# Patient Record
Sex: Male | Born: 1985 | Hispanic: No | Marital: Single | Smoking: Never smoker
Health system: Southern US, Community
[De-identification: ages and names within clinical notes are randomized; demographics above are authoritative.]

## PROBLEM LIST (undated history)

## (undated) DIAGNOSIS — N289 Disorder of kidney and ureter, unspecified: Secondary | ICD-10-CM

---

## 2017-03-01 ENCOUNTER — Emergency Department (HOSPITAL_COMMUNITY): Payer: BLUE CROSS/BLUE SHIELD

## 2017-03-01 ENCOUNTER — Encounter (HOSPITAL_COMMUNITY): Payer: Self-pay | Admitting: Emergency Medicine

## 2017-03-01 ENCOUNTER — Emergency Department (HOSPITAL_COMMUNITY)
Admission: EM | Admit: 2017-03-01 | Discharge: 2017-03-01 | Disposition: A | Discharge status: Home / Self Care | Payer: BLUE CROSS/BLUE SHIELD | Attending: Emergency Medicine | Admitting: Emergency Medicine

## 2017-03-01 DIAGNOSIS — Z79899 Other long term (current) drug therapy: Secondary | ICD-10-CM | POA: Insufficient documentation

## 2017-03-01 DIAGNOSIS — N201 Calculus of ureter: Secondary | ICD-10-CM | POA: Diagnosis not present

## 2017-03-01 DIAGNOSIS — R109 Unspecified abdominal pain: Secondary | ICD-10-CM | POA: Diagnosis present

## 2017-03-01 HISTORY — DX: Disorder of kidney and ureter, unspecified: N28.9

## 2017-03-01 LAB — BASIC METABOLIC PANEL
Anion gap: 9 (ref 5–15)
BUN: 16 mg/dL (ref 6–20)
CHLORIDE: 106 mmol/L (ref 101–111)
CO2: 27 mmol/L (ref 22–32)
CREATININE: 1.46 mg/dL — AB (ref 0.61–1.24)
Calcium: 9.2 mg/dL (ref 8.9–10.3)
GFR calc Af Amer: >60 mL/min (ref >60)
GFR calc non Af Amer: >60 mL/min (ref >60)
Glucose, Bld: 131 mg/dL — ABNORMAL HIGH (ref 65–99)
Potassium: 3.6 mmol/L (ref 3.5–5.1)
Sodium: 142 mmol/L (ref 135–145)

## 2017-03-01 LAB — CBC WITH DIFFERENTIAL/PLATELET
Basophils Absolute: 0 10*3/uL (ref 0.0–0.1)
Basophils Relative: 0 %
Eosinophils Absolute: 0.2 10*3/uL (ref 0.0–0.7)
Eosinophils Relative: 3 %
HEMATOCRIT: 39.7 % (ref 39.0–52.0)
HEMOGLOBIN: 13 g/dL (ref 13.0–17.0)
LYMPHS ABS: 1.4 10*3/uL (ref 0.7–4.0)
Lymphocytes Relative: 18 %
MCH: 26.2 pg (ref 26.0–34.0)
MCHC: 32.7 g/dL (ref 30.0–36.0)
MCV: 79.9 fL (ref 78.0–100.0)
MONOS PCT: 5 %
Monocytes Absolute: 0.4 10*3/uL (ref 0.1–1.0)
NEUTROS ABS: 5.7 10*3/uL (ref 1.7–7.7)
NEUTROS PCT: 74 %
Platelets: 174 10*3/uL (ref 150–400)
RBC: 4.97 MIL/uL (ref 4.22–5.81)
RDW: 13.5 % (ref 11.5–15.5)
WBC: 7.6 10*3/uL (ref 4.0–10.5)

## 2017-03-01 LAB — URINALYSIS, ROUTINE W REFLEX MICROSCOPIC
BILIRUBIN URINE: NEGATIVE
Glucose, UA: NEGATIVE mg/dL
Hgb urine dipstick: NEGATIVE
Ketones, ur: NEGATIVE mg/dL
LEUKOCYTES UA: NEGATIVE
Nitrite: NEGATIVE
PH: 9 — AB (ref 5.0–8.0)
Protein, ur: 100 mg/dL — AB
SPECIFIC GRAVITY, URINE: 1.025 (ref 1.005–1.030)

## 2017-03-01 LAB — HEPATIC FUNCTION PANEL
ALBUMIN: 4.1 g/dL (ref 3.5–5.0)
ALK PHOS: 66 U/L (ref 38–126)
ALT: 33 U/L (ref 17–63)
AST: 29 U/L (ref 15–41)
BILIRUBIN INDIRECT: 0.4 mg/dL (ref 0.3–0.9)
BILIRUBIN TOTAL: 0.5 mg/dL (ref 0.3–1.2)
Bilirubin, Direct: 0.1 mg/dL (ref 0.1–0.5)
Total Protein: 7.7 g/dL (ref 6.5–8.1)

## 2017-03-01 LAB — LIPASE, BLOOD: LIPASE: 26 U/L (ref 11–51)

## 2017-03-01 MED ORDER — TAMSULOSIN HCL 0.4 MG PO CAPS
0.4000 mg | ORAL_CAPSULE | Freq: Once | ORAL | Status: AC
  Administered 2017-03-01: 0.4 mg via ORAL
  Filled 2017-03-01: qty 1

## 2017-03-01 MED ORDER — HYDROMORPHONE HCL 1 MG/ML IJ SOLN: 1.0000 mg | Freq: Once | INTRAMUSCULAR | Status: DC

## 2017-03-01 MED ORDER — OXYCODONE-ACETAMINOPHEN 5-325 MG PO TABS: 1.0000 | ORAL_TABLET | ORAL | 0 refills | Status: AC | PRN

## 2017-03-01 MED ORDER — ONDANSETRON 8 MG PO TBDP: 8.0000 mg | ORAL_TABLET | Freq: Three times a day (TID) | ORAL | 0 refills | Status: AC | PRN

## 2017-03-01 MED ORDER — ONDANSETRON HCL 4 MG/5ML PO SOLN: 4.0000 mg | Freq: Once | ORAL | Status: DC

## 2017-03-01 MED ORDER — PROMETHAZINE HCL 25 MG/ML IJ SOLN
12.5000 mg | Freq: Once | INTRAMUSCULAR | Status: AC
  Administered 2017-03-01: 12.5 mg via INTRAVENOUS
  Filled 2017-03-01: qty 1

## 2017-03-01 MED ORDER — TAMSULOSIN HCL 0.4 MG PO CAPS: 0.4000 mg | ORAL_CAPSULE | Freq: Every day | ORAL | 0 refills | Status: AC

## 2017-03-01 MED ORDER — ONDANSETRON HCL 4 MG/2ML IJ SOLN
INTRAMUSCULAR | Status: AC
  Administered 2017-03-01: 4 mg
  Filled 2017-03-01: qty 2

## 2017-03-01 MED ORDER — MORPHINE SULFATE (PF) 4 MG/ML IV SOLN
4.0000 mg | INTRAVENOUS | Status: DC | PRN
  Administered 2017-03-01: 4 mg via INTRAVENOUS
  Filled 2017-03-01: qty 1

## 2017-03-01 MED ORDER — ONDANSETRON HCL 4 MG/2ML IJ SOLN: 4.0000 mg | Freq: Once | INTRAMUSCULAR | Status: DC

## 2017-03-01 NOTE — ED Notes (Signed)
Last meal last night was at [employer] at about 2330.

## 2017-03-01 NOTE — ED Notes (Signed)
Patient transported to CT 

## 2017-03-01 NOTE — ED Notes (Signed)
Pt has vomited multiple times.

## 2017-03-01 NOTE — ED Notes (Signed)
Pt return from bathroom, iv infiltrated.

## 2017-03-01 NOTE — ED Notes (Signed)
2 failed attempts at starting iv.  Charge to attempt.

## 2017-03-01 NOTE — ED Triage Notes (Signed)
Pt c/o sudden right upper flank pain with n/v x 12 since this am. Hx of kidney stones. States feels like one. Pt pointing to ruq lateral area with pain. Pt had emesis bag with large amount of emesis in it during triage. Pt mildy restles. .Marland Kitchen

## 2017-03-01 NOTE — Discharge Instructions (Addendum)
Strain your urine so you will know when this stone passes.  Save for your urology appointment.  You may take the oxycodone prescribed for pain relief.  This will make you drowsy - do not drive within 4 hours of taking this medication. Take your next dose of Flomax before bed tomorrow night as you received todays dose here. This medicine may help your stone pass quicker.

## 2017-03-01 NOTE — ED Notes (Signed)
Pt continues to have periods on vomiting and pain to right lateral abdomen.

## 2017-03-03 NOTE — ED Provider Notes (Signed)
[facility] DEPT Provider Note   CSN: [id number] Arrival date & time: 03/01/17  4098     History   Chief Complaint Chief Complaint  Patient presents with  . Flank Pain    HPI Geoffery Aultman is a 31 y.o. male.  The history is provided by the patient.  Flank Pain  This is a new problem. The current episode started 3 to 5 hours ago (right flank pain waking him from sleep). The problem occurs constantly. The problem has not changed since onset.Associated symptoms include abdominal pain. Pertinent negatives include no chest pain, no headaches and no shortness of breath. Associated symptoms comments: Nausea and emesis.  Denies fevers, hematuria, dysuria.. Nothing aggravates the symptoms. Nothing relieves the symptoms. He has tried nothing for the symptoms.    Past Medical History:  Diagnosis Date  . Renal disorder     There are no active problems to display for this patient.   History reviewed. No pertinent surgical history.     Home Medications    Prior to Admission medications   Medication Sig Start Date End Date Taking? Authorizing Provider  ondansetron (ZOFRAN ODT) 8 MG disintegrating tablet Take 1 tablet (8 mg total) by mouth every 8 (eight) hours as needed for nausea or vomiting. 03/01/17   Taz Vanness, Raynelle Fanning, PA-C  oxyCODONE-acetaminophen (PERCOCET/ROXICET) 5-325 MG tablet Take 1 tablet by mouth every 4 (four) hours as needed. 03/01/17   Burgess Amor, PA-C  tamsulosin (FLOMAX) 0.4 MG CAPS capsule Take 1 capsule (0.4 mg total) by mouth daily after supper. 03/01/17   Burgess Amor, PA-C    Family History History reviewed. No pertinent family history.  Social History Social History  Substance Use Topics  . Smoking status: Never Smoker  . Smokeless tobacco: Never Used  . Alcohol use Yes     Comment: occ      Allergies   Cashew nut oil   Review of Systems Review of Systems  Constitutional: Negative for chills and fever.  HENT: Negative.   Respiratory: Negative.   Negative for shortness of breath.   Cardiovascular: Negative.  Negative for chest pain.  Gastrointestinal: Positive for abdominal pain, nausea and vomiting. Negative for diarrhea.  Genitourinary: Positive for flank pain. Negative for dysuria and hematuria.  Neurological: Negative for headaches.     Physical Exam Updated Vital Signs BP 132/87   Pulse (!) 59   Temp 98.2 F (36.8 C) (Oral)   Resp 16   Ht 5\' 11"  (1.803 m)   Wt 81.6 kg (180 lb)   SpO2 99%   BMI 25.10 kg/m   Physical Exam  Constitutional: He appears well-developed and well-nourished.  HENT:  Head: Normocephalic and atraumatic.  Eyes: Conjunctivae are normal.  Neck: Normal range of motion.  Cardiovascular: Normal rate, regular rhythm, normal heart sounds and intact distal pulses.   Pulmonary/Chest: Effort normal and breath sounds normal. He has no wheezes.  Abdominal: Soft. Bowel sounds are normal. He exhibits no distension. There is tenderness in the right upper quadrant. There is CVA tenderness. There is negative Murphy's sign.  Musculoskeletal: Normal range of motion.  Neurological: He is alert.  Skin: Skin is warm and dry.  Psychiatric: He has a normal mood and affect.  Nursing note and vitals reviewed.    ED Treatments / Results  Labs (all labs ordered are listed, but only abnormal results are displayed) Labs Reviewed  URINALYSIS, ROUTINE W REFLEX MICROSCOPIC - Abnormal; Notable for the following:       Result Value  APPearance CLOUDY (*)    pH 9.0 (*)    Protein, ur 100 (*)    Bacteria, UA FEW (*)    Squamous Epithelial / LPF 0-5 (*)    All other components within normal limits  BASIC METABOLIC PANEL - Abnormal; Notable for the following:    Glucose, Bld 131 (*)    Creatinine, Ser 1.46 (*)    All other components within normal limits  CBC WITH DIFFERENTIAL/PLATELET  HEPATIC FUNCTION PANEL  LIPASE, BLOOD    EKG  EKG Interpretation None       Radiology Ct Renal Stone Study  Result  Date: 03/01/2017 CLINICAL DATA:  31 year old male with sudden onset right upper flank pain nausea and vomiting since this morning. EXAM: CT ABDOMEN AND PELVIS WITHOUT CONTRAST TECHNIQUE: Multidetector CT imaging of the abdomen and pelvis was performed following the standard protocol without IV contrast. COMPARISON:  None. FINDINGS: Lower chest: Minimal atelectasis on the left, otherwise normal lung bases. No pericardial or pleural effusion. Hepatobiliary: Negative noncontrast liver and gallbladder. Pancreas: Negative. Spleen: Negative. Adrenals/Urinary Tract: Normal adrenal glands. Several punctate calculi in the left kidney which otherwise has a normal noncontrast appearance. No left nephrolithiasis. The visible left ureter appears normal. The right kidney is asymmetrically hypodense and indistinct with moderate right hydronephrosis. Mild perinephric stranding. Mild to moderate right hydroureter with periureteral stranding. The right ureter remains dilated to the ureterovesical junction where a 4 mm calculus is located at the UVJ or just inside the urinary bladder. The bladder is otherwise decompressed and negative. There are scattered bilateral pelvic phleboliths. No other right nephrolithiasis. Stomach/Bowel: Decompressed rectum. Redundant but mostly decompressed sigmoid colon. Decompressed left colon. Some gas and stool in the transverse colon and right colon. The cecum is mostly located in the right hemipelvis. No inflammation of the appendix. Small bowel is mostly decompressed throughout the abdomen. Decompressed stomach and duodenum. No abdominal free fluid. Vascular/Lymphatic: Vascular patency is not evaluated in the absence of IV contrast. Reproductive: Negative. Other: No pelvic free fluid. Musculoskeletal: Negative. IMPRESSION: 1. Acute obstructive uropathy on the right with a 4 mm calculus at the right UVJ or just inside the bladder. 2. No other right nephrolithiasis.  Punctate left nephrolithiasis.  Electronically Signed   By: Odessa FlemingH  Hall M.D.   On: 03/01/2017 11:33    Procedures Procedures (including critical care time)  Medications Ordered in ED Medications  ondansetron (ZOFRAN) 4 MG/2ML injection (4 mg  Given 03/01/17 0824)  promethazine (PHENERGAN) injection 12.5 mg (12.5 mg Intravenous Given 03/01/17 0839)  promethazine (PHENERGAN) injection 12.5 mg (12.5 mg Intravenous Given 03/01/17 0920)  tamsulosin (FLOMAX) capsule 0.4 mg (0.4 mg Oral Given 03/01/17 1209)     Initial Impression / Assessment and Plan / ED Course  I have reviewed the triage vital signs and the nursing notes.  Pertinent labs & imaging results that were available during my care of the patient were reviewed by me and considered in my medical decision making (see chart for details).     Pt with right ureterolithiasis. Strict return precautions discussed with pt and wife.  Urine strainer provided. Referral to urology for f/u within one week. Pt improved at time of dc.  Final Clinical Impressions(s) / ED Diagnoses   Final diagnoses:  Ureterolithiasis    New Prescriptions Discharge Medication List as of 03/01/2017 12:11 PM    START taking these medications   Details  ondansetron (ZOFRAN ODT) 8 MG disintegrating tablet Take 1 tablet (8 mg total) by mouth every  8 (eight) hours as needed for nausea or vomiting., Starting Thu 03/01/2017, Print    oxyCODONE-acetaminophen (PERCOCET/ROXICET) 5-325 MG tablet Take 1 tablet by mouth every 4 (four) hours as needed., Starting Thu 03/01/2017, Print    tamsulosin (FLOMAX) 0.4 MG CAPS capsule Take 1 capsule (0.4 mg total) by mouth daily after supper., Starting Thu 03/01/2017, Print         Burgess Amor, PA-C 03/03/17 0927    Samuel Jester, DO 03/05/17 Rickey Primus

## 2018-06-29 IMAGING — CT CT RENAL STONE PROTOCOL
2 of 4 series · 16 of 46 positions shown, 18 images · non-contrast
Comparison: None.

CLINICAL DATA: 31-year-old male with sudden onset right upper flank
pain nausea and vomiting since this morning.

EXAM:
CT ABDOMEN AND PELVIS WITHOUT CONTRAST
TECHNIQUE: Multidetector CT imaging of the abdomen and pelvis was performed
following the standard protocol without IV contrast.

[Series 2: axial st · axial · 0.67mm/px · z∈[-470,-76]mm · 13 of 87 slices shown, 15 images]
[im 4/87  soft-tissue]
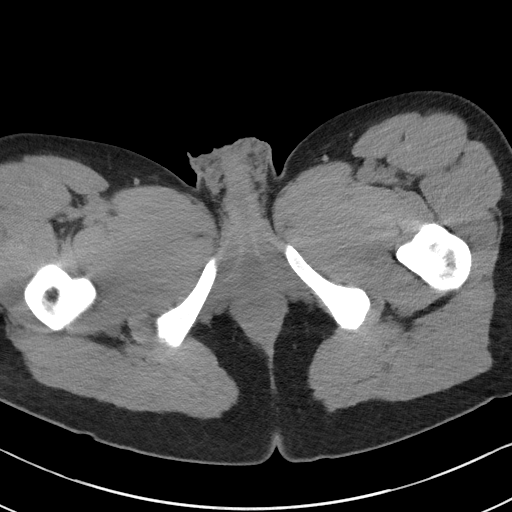
[im 4/87  bone]
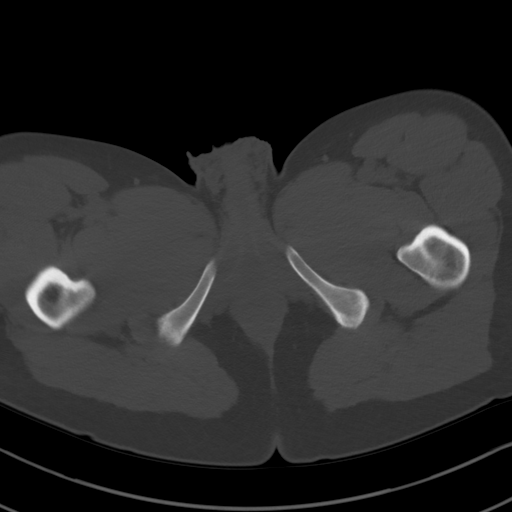
[im 11/87  soft-tissue]
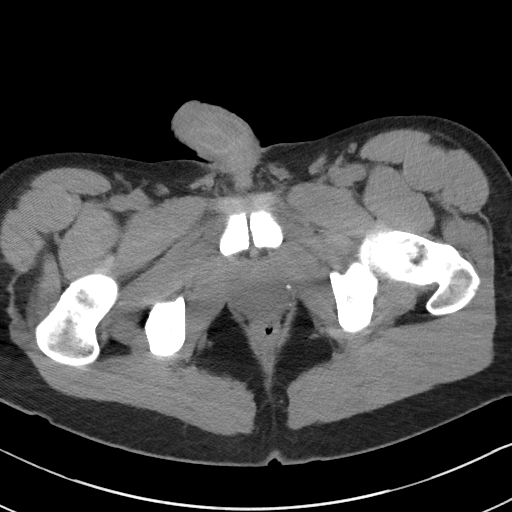
[im 18/87  soft-tissue]
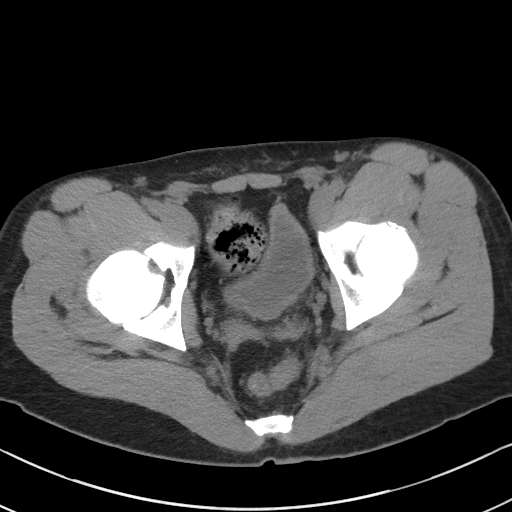
[im 25/87  soft-tissue]
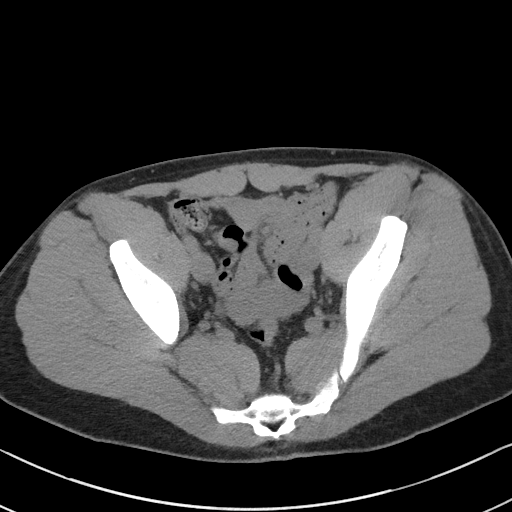
[im 31/87  soft-tissue]
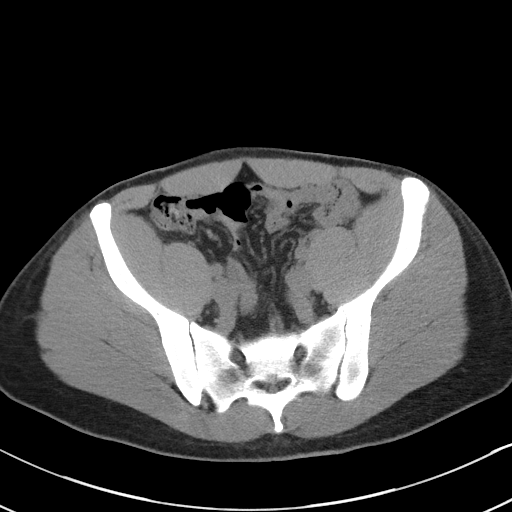
[im 38/87  soft-tissue]
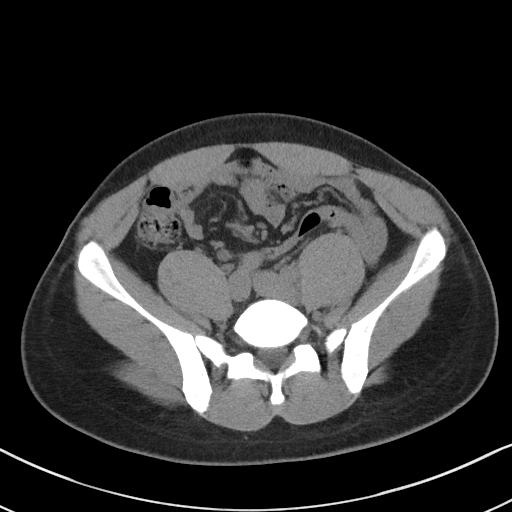
[im 45/87  soft-tissue]
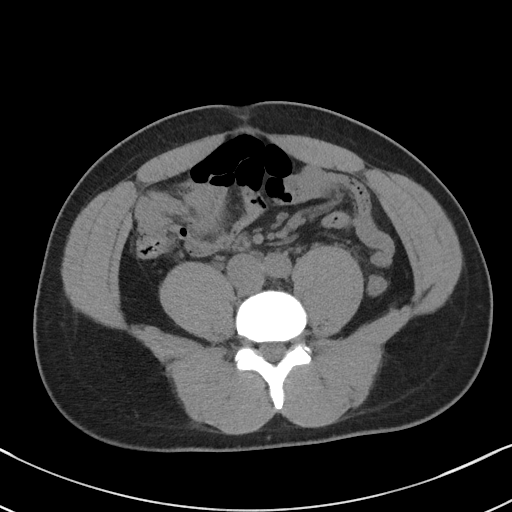
[im 49/87  soft-tissue]
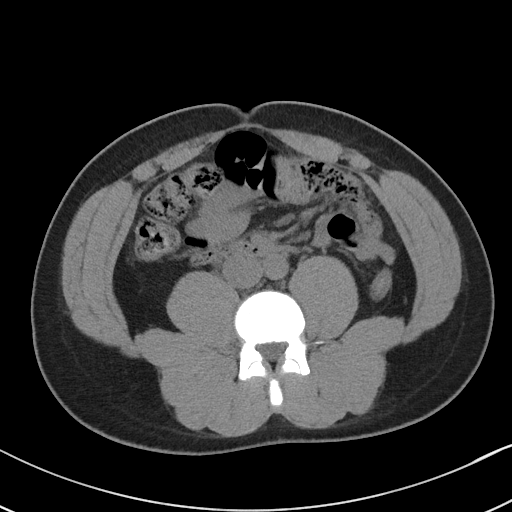
[im 56/87  soft-tissue]
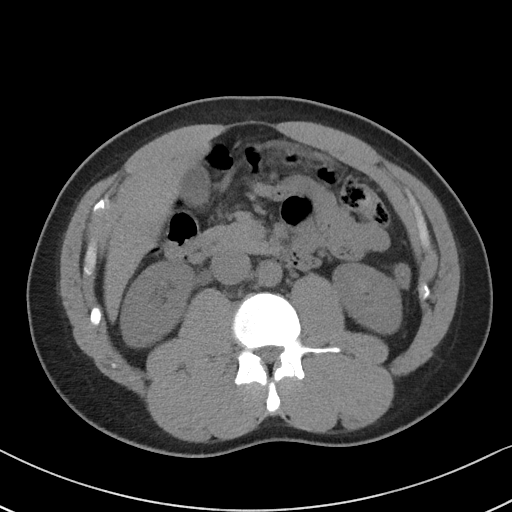
[im 56/87  bone]
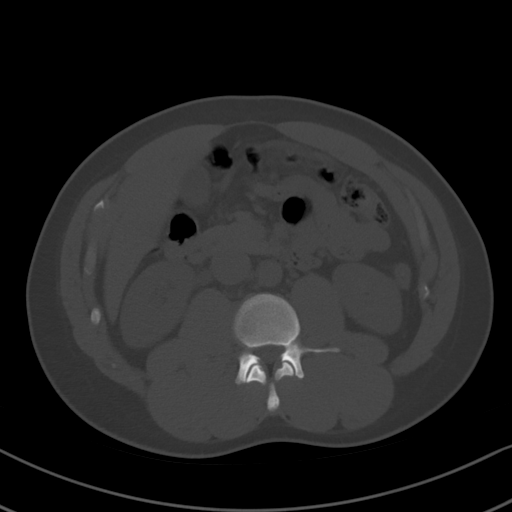
[im 62/87  soft-tissue]
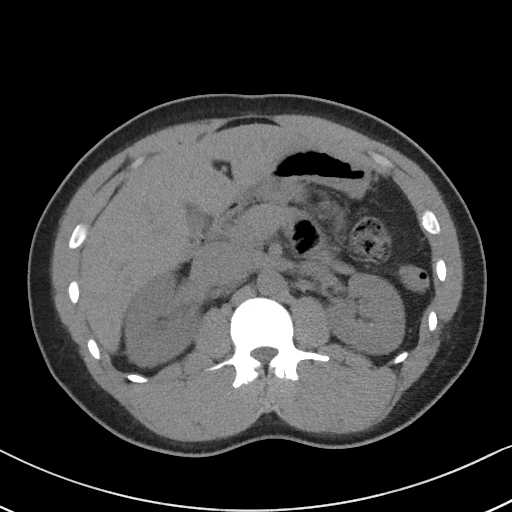
[im 69/87  soft-tissue]
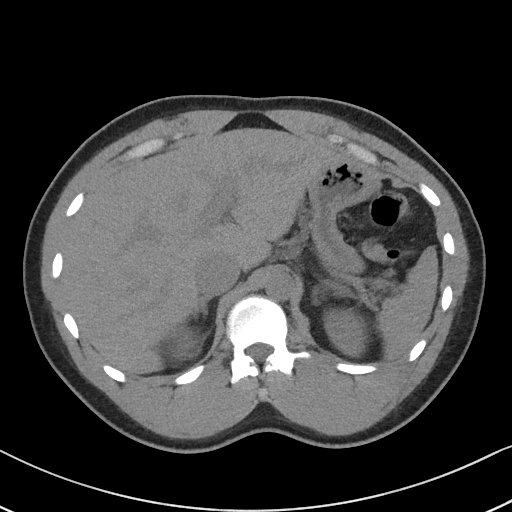
[im 76/87  soft-tissue]
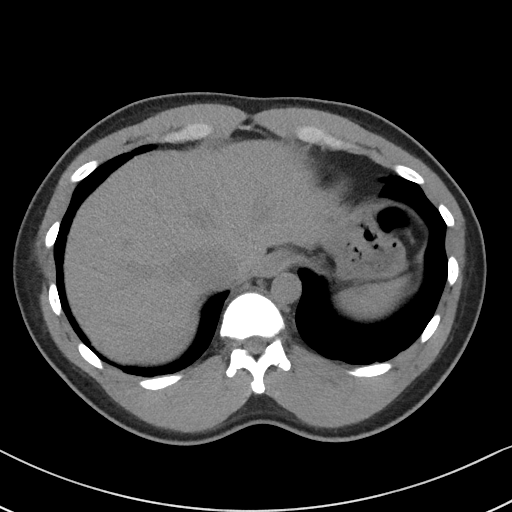
[im 83/87  soft-tissue]
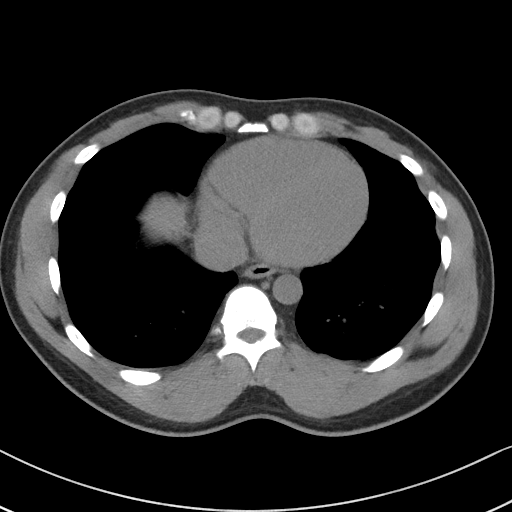

[Series 5: coronal st · coronal · 0.67mm/px · 3 of 85 slices shown]
[im 29/85  soft-tissue]
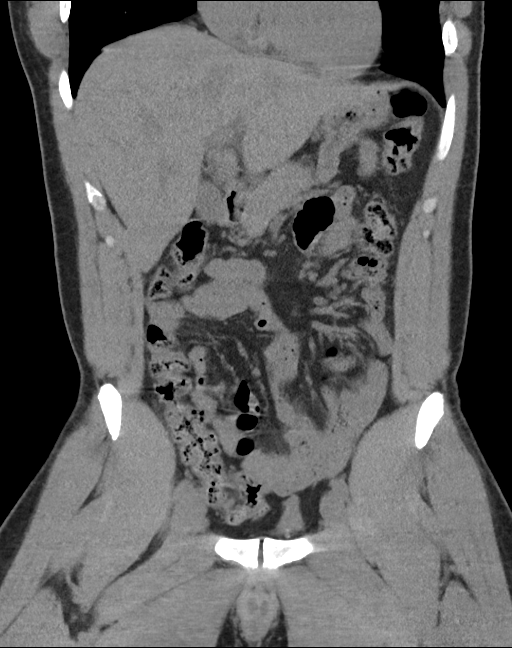
[im 38/85  soft-tissue]
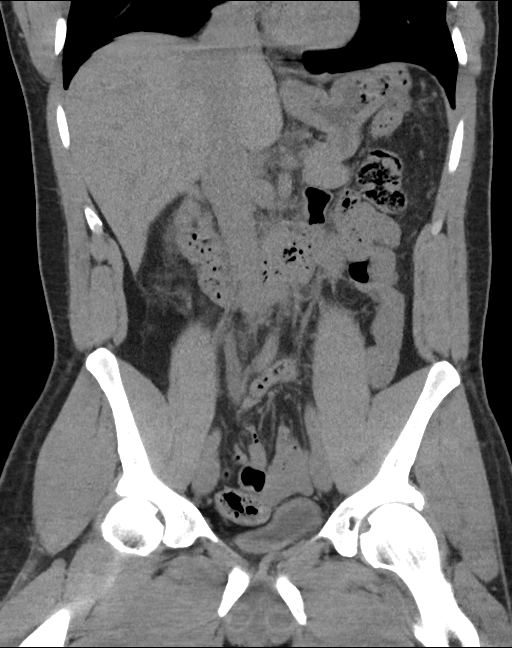
[im 47/85  soft-tissue]
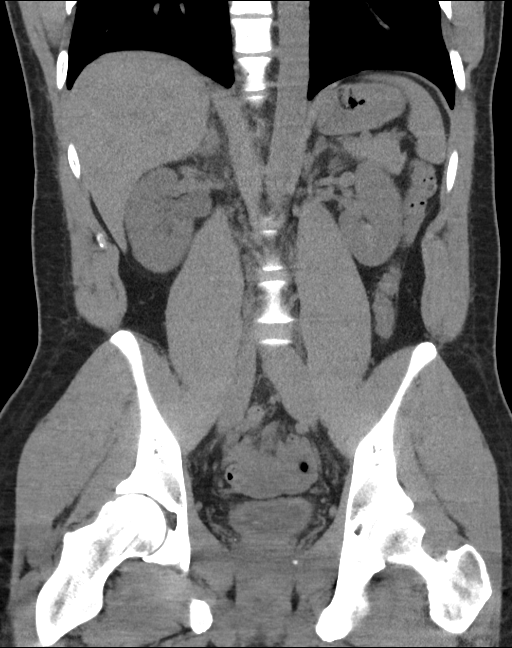

[16 of 46 positions shown; findings below may reference images not displayed]

FINDINGS: Lower chest: Minimal atelectasis on the left, otherwise normal lung
bases. No pericardial or pleural effusion.

Hepatobiliary: Negative noncontrast liver and gallbladder.

Pancreas: Negative.

Spleen: Negative.

Adrenals/Urinary Tract: Normal adrenal glands. Several punctate
calculi in the left kidney which otherwise has a normal noncontrast
appearance. No left nephrolithiasis. The visible left ureter appears
normal.

The right kidney is asymmetrically hypodense and indistinct with
moderate right hydronephrosis. Mild perinephric stranding. Mild to
moderate right hydroureter with periureteral stranding. The right
ureter remains dilated to the ureterovesical junction where a 4 mm
calculus is located at the UVJ or just inside the urinary bladder.
The bladder is otherwise decompressed and negative. There are
scattered bilateral pelvic phleboliths.

No other right nephrolithiasis.

Stomach/Bowel: Decompressed rectum. Redundant but mostly
decompressed sigmoid colon. Decompressed left colon. Some gas and
stool in the transverse colon and right colon. The cecum is mostly
located in the right hemipelvis. No inflammation of the appendix.
Small bowel is mostly decompressed throughout the abdomen.
Decompressed stomach and duodenum.

No abdominal free fluid.

Vascular/Lymphatic: Vascular patency is not evaluated in the absence
of IV contrast.

Reproductive: Negative.

Other: No pelvic free fluid.

Musculoskeletal: Negative.
IMPRESSION: 1. Acute obstructive uropathy on the right with a 4 mm calculus at
the right UVJ or just inside the bladder.
2. No other right nephrolithiasis.  Punctate left nephrolithiasis.
